# Patient Record
Sex: Male | Born: 2000
Health system: Southern US, Community
[De-identification: ages and names within clinical notes are randomized; demographics above are authoritative.]

## PROBLEM LIST (undated history)

## (undated) DIAGNOSIS — J45909 Unspecified asthma, uncomplicated: Secondary | ICD-10-CM

## (undated) DIAGNOSIS — S060X9A Concussion with loss of consciousness of unspecified duration, initial encounter: Secondary | ICD-10-CM

---
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
## 2022-05-22 ENCOUNTER — Emergency Department (HOSPITAL_COMMUNITY)
Admission: EM | Admit: 2022-05-22 | Discharge: 2022-05-22 | Disposition: A | Discharge status: Home / Self Care | Payer: PRIVATE HEALTH INSURANCE | Attending: Emergency Medicine | Admitting: Emergency Medicine

## 2022-05-22 ENCOUNTER — Other Ambulatory Visit: Payer: Self-pay

## 2022-05-22 ENCOUNTER — Encounter (HOSPITAL_COMMUNITY): Payer: Self-pay | Admitting: *Deleted

## 2022-05-22 ENCOUNTER — Emergency Department (HOSPITAL_COMMUNITY): Payer: PRIVATE HEALTH INSURANCE

## 2022-05-22 DIAGNOSIS — M62838 Other muscle spasm: Secondary | ICD-10-CM | POA: Insufficient documentation

## 2022-05-22 DIAGNOSIS — R55 Syncope and collapse: Secondary | ICD-10-CM | POA: Diagnosis present

## 2022-05-22 DIAGNOSIS — Z79899 Other long term (current) drug therapy: Secondary | ICD-10-CM | POA: Diagnosis not present

## 2022-05-22 DIAGNOSIS — J45909 Unspecified asthma, uncomplicated: Secondary | ICD-10-CM | POA: Diagnosis not present

## 2022-05-22 DIAGNOSIS — F419 Anxiety disorder, unspecified: Secondary | ICD-10-CM | POA: Diagnosis not present

## 2022-05-22 LAB — URINALYSIS, ROUTINE W REFLEX MICROSCOPIC
Bilirubin Urine: NEGATIVE
Glucose, UA: NEGATIVE mg/dL
Hgb urine dipstick: NEGATIVE
Ketones, ur: NEGATIVE mg/dL
Leukocytes,Ua: NEGATIVE
Nitrite: NEGATIVE
Protein, ur: NEGATIVE mg/dL
Specific Gravity, Urine: 1.004 — ABNORMAL LOW (ref 1.005–1.030)
pH: 9 — ABNORMAL HIGH (ref 5.0–8.0)

## 2022-05-22 LAB — RAPID URINE DRUG SCREEN, HOSP PERFORMED
Amphetamines: NOT DETECTED
Barbiturates: NOT DETECTED
Benzodiazepines: NOT DETECTED
Cocaine: NOT DETECTED
Opiates: NOT DETECTED
Tetrahydrocannabinol: POSITIVE — AB

## 2022-05-22 LAB — TROPONIN I (HIGH SENSITIVITY)
Troponin I (High Sensitivity): 4 ng/L (ref <18)
Troponin I (High Sensitivity): 3 ng/L (ref <18)

## 2022-05-22 LAB — BASIC METABOLIC PANEL
Anion gap: 7 (ref 5–15)
BUN: 11 mg/dL (ref 6–20)
CO2: 22 mmol/L (ref 22–32)
Calcium: 9.4 mg/dL (ref 8.9–10.3)
Chloride: 108 mmol/L (ref 98–111)
Creatinine, Ser: 0.79 mg/dL (ref 0.61–1.24)
GFR, Estimated: >60 mL/min (ref >60)
Glucose, Bld: 97 mg/dL (ref 70–99)
Potassium: 3.8 mmol/L (ref 3.5–5.1)
Sodium: 137 mmol/L (ref 135–145)

## 2022-05-22 LAB — CBC
HCT: 45.1 % (ref 39.0–52.0)
Hemoglobin: 15.5 g/dL (ref 13.0–17.0)
MCH: 30.2 pg (ref 26.0–34.0)
MCHC: 34.4 g/dL (ref 30.0–36.0)
MCV: 87.7 fL (ref 80.0–100.0)
Platelets: 291 10*3/uL (ref 150–400)
RBC: 5.14 MIL/uL (ref 4.22–5.81)
RDW: 12.1 % (ref 11.5–15.5)
WBC: 5.6 10*3/uL (ref 4.0–10.5)
nRBC: 0 % (ref 0.0–0.2)

## 2022-05-22 LAB — CK: Total CK: 119 U/L (ref 49–397)

## 2022-05-22 LAB — CBG MONITORING, ED: Glucose-Capillary: 101 mg/dL — ABNORMAL HIGH (ref 70–99)

## 2022-05-22 LAB — MAGNESIUM: Magnesium: 2.3 mg/dL (ref 1.7–2.4)

## 2022-05-22 MED ORDER — DIAZEPAM 5 MG PO TABS
5.0000 mg | ORAL_TABLET | Freq: Once | ORAL | Status: AC
  Administered 2022-05-22: 5 mg via ORAL
  Filled 2022-05-22: qty 1

## 2022-05-22 MED ORDER — SODIUM CHLORIDE 0.9 % IV BOLUS
1000.0000 mL | Freq: Once | INTRAVENOUS | Status: AC
  Administered 2022-05-22: 1000 mL via INTRAVENOUS

## 2022-05-22 MED ORDER — HYDROXYZINE HCL 25 MG PO TABS: 25.0000 mg | ORAL_TABLET | Freq: Four times a day (QID) | ORAL | 0 refills | Status: DC | PRN

## 2022-05-22 MED ORDER — METHOCARBAMOL 500 MG PO TABS: 1000.0000 mg | ORAL_TABLET | Freq: Two times a day (BID) | ORAL | 0 refills | Status: AC

## 2022-05-22 NOTE — Discharge Instructions (Signed)
Have someone stay with you until you feel stable. Do not drive, operate machinery, or play sports until your caregiver says it is okay. Keep all follow-up appointments as directed by your caregiver. Lie down right away if you start feeling like you might faint. Breathe deeply and steadily. Wait until all the symptoms have passed.Drink enough fluids to keep your urine clear or pale yellow.   SEEK IMMEDIATE MEDICAL CARE IF: You have a severe headache. You have unusual pain in the chest, abdomen, or back. You are bleeding from the mouth or rectum, or you have a black or tarry stool. You have an irregular or very fast heartbeat. You have pain with breathing. You have repeated fainting or seizure-like jerking during an episode. You faint when sitting or lying down. You have confusion. You have difficulty walking. You have severe weakness. You have vision problems. If you fainted, call your local emergency services - do not drive yourself to the hospital.   Please return to the emergency department immediately for any new or concerning symptoms, or if you get worse.

## 2022-05-22 NOTE — ED Notes (Signed)
Pt requesting his IV to be removed and put in a different location

## 2022-05-22 NOTE — ED Provider Notes (Signed)
[facility] EMERGENCY DEPARTMENT Provider Note   CSN: [id number] Arrival date & time: 05/22/22  3016     History  Chief Complaint  Patient presents with   Loss of Consciousness    Anthony Bonilla is a 21 y.o. male.  Patient as above with significant medical history as below, including asthma, concussion who presents to the ED with complaint of loc, muscle cramps, multiple complaints. Reports over the pats few months has been having episodes where his body will tense up, start having muscle cramps in his arms/legs, tingling hands/feet. Will resolve after a few seconds. No LOC during this time. Feels episodes have been more frequent last few days. Multiple episodes a day. While showering this morning he felt like he passed out, he remembers washing his hair and then woke up in his girlfriends arms on the floor, was not confused or acting abnormally per spouse, no vomiting or incontinence. No CP or palpitations prior to this or following this. Pt does report feeling mildly nauseated prior to episode this AM but no vomiting. Had repeat episode while he was in the car on the way to ED, felt like his "eyes went black" but he could still perceive his surroundings and hear what was going on around him.  Endorses intermittent THC use, last use was yesterday No etoh use, no other illicit drug use. Has not seen PCP for this in the past No hx of sudden cardiac death or arrhythmia.      Past Medical History:  Diagnosis Date   Asthma    Concussion 07/2016   has been cleared to play soccer again    Past Surgical History:  Procedure Laterality Date   OPEN REDUCTION INTERNAL FIXATION (ORIF) METACARPAL Right 09/01/2016   Procedure: OPEN REDUCTION INTERNAL FIXATION (ORIF) RIGHT FIFTH METACARPAL;  Surgeon: Vickki Hearing, MD;  Location: [facility];  Service: Orthopedics;  Laterality: Right;  right fifth metacarpal   TRIGGER FINGER RELEASE Right 10/29/2020   Procedure: RELEASE TRIGGER FINGER/A-1  PULLEY right small finger/ and HARDWARE REMOVAL RIGHT HAND;  Surgeon: Vickki Hearing, MD;  Location: [facility];  Service: Orthopedics;  Laterality: Right;     The history is provided by the patient and the spouse. No language interpreter was used.  Loss of Consciousness Associated symptoms: nausea   Associated symptoms: no chest pain, no confusion, no fever, no headaches, no palpitations, no shortness of breath and no vomiting        Home Medications Prior to Admission medications   Medication Sig Start Date End Date Taking? Authorizing Provider  hydrOXYzine (ATARAX) 25 MG tablet Take 1 tablet (25 mg total) by mouth every 6 (six) hours as needed for anxiety. 05/22/22  Yes Sloan Leiter, DO  methocarbamol (ROBAXIN) 500 MG tablet Take 2 tablets (1,000 mg total) by mouth 2 (two) times daily for 5 days. 05/22/22 05/27/22 Yes Tanda Rockers A, DO  ibuprofen (ADVIL) 800 MG tablet Take 1 tablet (800 mg total) by mouth every 8 (eight) hours as needed. Patient not taking: Reported on 02/10/2021 10/29/20   Vickki Hearing, MD      Allergies    Patient has no known allergies.    Review of Systems   Review of Systems  Constitutional:  Negative for chills and fever.  HENT:  Negative for facial swelling and trouble swallowing.   Eyes:  Negative for photophobia and visual disturbance.  Respiratory:  Negative for cough and shortness of breath.   Cardiovascular:  Positive for  syncope. Negative for chest pain and palpitations.  Gastrointestinal:  Positive for nausea. Negative for abdominal pain and vomiting.  Endocrine: Negative for polydipsia and polyuria.  Genitourinary:  Negative for difficulty urinating and hematuria.  Musculoskeletal:  Positive for myalgias. Negative for gait problem and joint swelling.  Skin:  Negative for pallor and rash.  Neurological:  Positive for syncope and light-headedness. Negative for headaches.  Psychiatric/Behavioral:  Negative for agitation and confusion.      Physical Exam Updated Vital Signs BP (!) 115/54   Pulse (!) 49   Temp 97.8 F (36.6 C) (Oral)   Resp 19   Ht 6\' 1"  (1.854 m)   Wt 74.8 kg   SpO2 100%   BMI 21.77 kg/m  Physical Exam Vitals and nursing note reviewed.  Constitutional:      General: He is not in acute distress.    Appearance: Normal appearance. He is well-developed. He is not ill-appearing, toxic-appearing or diaphoretic.  HENT:     Head: Normocephalic and atraumatic. No raccoon eyes, Battle's sign, right periorbital erythema or left periorbital erythema.     Jaw: There is normal jaw occlusion. No trismus.     Comments: Poor dentition     Right Ear: External ear normal.     Left Ear: External ear normal.     Mouth/Throat:     Mouth: Mucous membranes are moist.  Eyes:     General: No scleral icterus.    Extraocular Movements: Extraocular movements intact.     Pupils: Pupils are equal, round, and reactive to light.  Cardiovascular:     Rate and Rhythm: Regular rhythm. Bradycardia present.     Pulses: Normal pulses.          Radial pulses are 2+ on the right side and 2+ on the left side.       Dorsalis pedis pulses are 2+ on the right side and 2+ on the left side.     Heart sounds: Normal heart sounds. Heart sounds not distant.     No gallop. No S3 or S4 sounds.  Pulmonary:     Effort: Pulmonary effort is normal. No respiratory distress.     Breath sounds: Normal breath sounds.  Abdominal:     General: Abdomen is flat.     Palpations: Abdomen is soft.     Tenderness: There is no abdominal tenderness.  Musculoskeletal:        General: Normal range of motion.     Cervical back: Normal range of motion.     Right lower leg: No edema.     Left lower leg: No edema.  Skin:    General: Skin is warm and dry.     Capillary Refill: Capillary refill takes less than 2 seconds.  Neurological:     Mental Status: He is alert and oriented to person, place, and time.     GCS: GCS eye subscore is 4. GCS verbal  subscore is 5. GCS motor subscore is 6.     Cranial Nerves: Cranial nerves 2-12 are intact. No dysarthria or facial asymmetry.     Sensory: Sensation is intact. No sensory deficit.     Motor: Motor function is intact. No tremor.     Coordination: Coordination is intact. Finger-Nose-Finger Test normal.     Gait: Gait is intact.     Comments: Strength BLLE 5/5 Strength BLUE 5/5  Psychiatric:        Mood and Affect: Mood normal.  Behavior: Behavior normal.     ED Results / Procedures / Treatments   Labs (all labs ordered are listed, but only abnormal results are displayed) Labs Reviewed  URINALYSIS, ROUTINE W REFLEX MICROSCOPIC - Abnormal; Notable for the following components:      Result Value   Color, Urine STRAW (*)    Specific Gravity, Urine 1.004 (*)    pH 9.0 (*)    All other components within normal limits  RAPID URINE DRUG SCREEN, HOSP PERFORMED - Abnormal; Notable for the following components:   Tetrahydrocannabinol POSITIVE (*)    All other components within normal limits  CBG MONITORING, ED - Abnormal; Notable for the following components:   Glucose-Capillary 101 (*)    All other components within normal limits  BASIC METABOLIC PANEL  CBC  CK  MAGNESIUM  TROPONIN I (HIGH SENSITIVITY)  TROPONIN I (HIGH SENSITIVITY)    EKG EKG Interpretation  Date/Time:  Monday May 22 2022 06:43:15 EDT Ventricular Rate:  56 PR Interval:  109 QRS Duration: 100 QT Interval:  399 QTC Calculation: 385 R Axis:   92 Text Interpretation: Sinus rhythm w/ sinus arrhythmia (now absent: junctional rhythm) Rightward axis Otherwise normal ECG Compared to previous tracing Confirmed by Greg Cutter ([id number]) on 05/22/2022 10:52:07 AM  Radiology DG Chest Portable 1 View  Result Date: 05/22/2022 CLINICAL DATA:  Pt c/o having "passing out" episodes that started x 3 days ago. Right before the episodes start patient has cramping all over bodyloc EXAM: PORTABLE CHEST 1 VIEW COMPARISON:   None Available. FINDINGS: Normal mediastinum and cardiac silhouette. Normal pulmonary vasculature. No evidence of effusion, infiltrate, or pneumothorax. No acute bony abnormality. IMPRESSION: No acute cardiopulmonary process. Electronically Signed   By: Genevive Bi M.D.   On: 05/22/2022 08:07    Procedures Procedures    Medications Ordered in ED Medications  sodium chloride 0.9 % bolus 1,000 mL (0 mLs Intravenous Stopped 05/22/22 1202)  diazepam (VALIUM) tablet 5 mg (5 mg Oral Given 05/22/22 1610)    ED Course/ Medical Decision Making/ A&P                           Medical Decision Making Amount and/or Complexity of Data Reviewed Labs: ordered. Radiology: ordered.  Risk Prescription drug management.    CC: ?loc, muscle spasms  This patient presents to the Emergency Department for the above complaint. This involves an extensive number of treatment options and is a complaint that carries with it a high risk of complications and morbidity. Vital signs were reviewed. Serious etiologies considered.  Differential diagnoses includes but not limited to psychogenic, seizure, atypical/psychogenic seizure, substance induced, cardiac, metabolic, endocrine derangement, other acute etiologies were considered.   Record review:  Previous records obtained and reviewed prior visits, prior labs/imaging/admission   Additional history obtained from spouse, parent  Medical and surgical history as noted above.   Work up as above, notable for:  Labs & imaging results that were available during my care of the patient were visualized by me and considered in my medical decision making.  Physical exam as above.   I ordered imaging studies which included chest x-ray. I visualized the imaging, interpreted images, and I agree with radiologist interpretation.  No acute process  Cardiac monitoring reviewed and interpreted personally which shows NSR   Labs reviewed, UDS positive for THC, otherwise  labs unremarkable ECG without acute ischemic changes, troponin negative x2, chest x-ray negative, no chest pain.  Low  suspicion ACS. Neurologically he is intact entirely  Management: Valium, IV fluids  ED Course:     Reassessment:  Symptoms improved.  Admission was considered.   Patient presents with syncopal symptoms without worrisome features. Presentation most suggestive of neuro-cardiogenic or orthostatic cause. Very low suspicion for serious arrhythmia, cardiac ischemia or other serious etiology. ECG reviewed, no evidence of a cardiac arrhythmia such as Brugada, WPW, HOCM, IHSS, Long or short QT.  Syncope warnings discussed with patient.   Neurologic exam is nonfocal, not consistent with CVA or primary neurologic abnormality.   Patient appears safe for discharge with outpatient observation and close PCP F/U.   Feel there is potentially a psychogenic component to the patient's symptoms today.  He did have apparent unprovoked syncopal episode without prodrome earlier today.  We will give patient cardiology follow-up for Holter monitor.  No arrhythmia on telemetry today but does have sinus bradycardia.  Reports his heart rate is typically low.  Given resource info regarding counseling, give atarax rx   The patient has been instructed to return immediately if the symptoms worsen in any way. Patient verbalized understanding and is in agreement with current care plan. All questions answered prior to discharge.             Social determinants of health include -  Social History   Socioeconomic History   Marital status: Single    Spouse name: Not on file   Number of children: Not on file   Years of education: Not on file   Highest education level: Not on file  Occupational History   Not on file  Tobacco Use   Smoking status: Passive Smoke Exposure - Never Smoker   Smokeless tobacco: Never  Vaping Use   Vaping Use: Every day   Substances: Nicotine, Flavoring   Substance and Sexual Activity   Alcohol use: No   Drug use: No   Sexual activity: Never    Birth control/protection: None  Other Topics Concern   Not on file  Social History Narrative   Not on file   Social Determinants of Health   Financial Resource Strain: Not on file  Food Insecurity: Not on file  Transportation Needs: Not on file  Physical Activity: Not on file  Stress: Not on file  Social Connections: Not on file  Intimate Partner Violence: Not on file      This chart was dictated using voice recognition software.  Despite best efforts to proofread,  errors can occur which can change the documentation meaning.         Final Clinical Impression(s) / ED Diagnoses Final diagnoses:  Anxiousness  Muscle spasm  Syncope, unspecified syncope type    Rx / DC Orders ED Discharge Orders          Ordered    methocarbamol (ROBAXIN) 500 MG tablet  2 times daily        05/22/22 1230    hydrOXYzine (ATARAX) 25 MG tablet  Every 6 hours PRN        05/22/22 1230    Ambulatory referral to Cardiology        05/22/22 1231              Sloan Leiter, DO 05/22/22 1233

## 2022-05-22 NOTE — ED Triage Notes (Signed)
Pt c/o having "passing out" episodes that started x 3 days ago. Right before the episodes start patient has cramping all over body

## 2022-11-15 ENCOUNTER — Emergency Department (HOSPITAL_COMMUNITY)
Admission: EM | Admit: 2022-11-15 | Discharge: 2022-11-15 | Disposition: A | Discharge status: Home / Self Care | Payer: PRIVATE HEALTH INSURANCE | Attending: Emergency Medicine | Admitting: Emergency Medicine

## 2022-11-15 ENCOUNTER — Other Ambulatory Visit: Payer: Self-pay

## 2022-11-15 DIAGNOSIS — R197 Diarrhea, unspecified: Secondary | ICD-10-CM | POA: Insufficient documentation

## 2022-11-15 DIAGNOSIS — M62838 Other muscle spasm: Secondary | ICD-10-CM | POA: Insufficient documentation

## 2022-11-15 DIAGNOSIS — R252 Cramp and spasm: Secondary | ICD-10-CM

## 2022-11-15 DIAGNOSIS — J45909 Unspecified asthma, uncomplicated: Secondary | ICD-10-CM | POA: Insufficient documentation

## 2022-11-15 DIAGNOSIS — R112 Nausea with vomiting, unspecified: Secondary | ICD-10-CM | POA: Diagnosis not present

## 2022-11-15 LAB — LIPASE, BLOOD: Lipase: 33 U/L (ref 11–51)

## 2022-11-15 LAB — CBC WITH DIFFERENTIAL/PLATELET
Abs Immature Granulocytes: 0.01 10*3/uL (ref 0.00–0.07)
Basophils Absolute: 0 10*3/uL (ref 0.0–0.1)
Basophils Relative: 1 %
Eosinophils Absolute: 0.1 10*3/uL (ref 0.0–0.5)
Eosinophils Relative: 2 %
HCT: 46.2 % (ref 39.0–52.0)
Hemoglobin: 15.5 g/dL (ref 13.0–17.0)
Immature Granulocytes: 0 %
Lymphocytes Relative: 24 %
Lymphs Abs: 1.7 10*3/uL (ref 0.7–4.0)
MCH: 30.2 pg (ref 26.0–34.0)
MCHC: 33.5 g/dL (ref 30.0–36.0)
MCV: 90.1 fL (ref 80.0–100.0)
Monocytes Absolute: 0.5 10*3/uL (ref 0.1–1.0)
Monocytes Relative: 7 %
Neutro Abs: 4.7 10*3/uL (ref 1.7–7.7)
Neutrophils Relative %: 66 %
Platelets: 278 10*3/uL (ref 150–400)
RBC: 5.13 MIL/uL (ref 4.22–5.81)
RDW: 11.8 % (ref 11.5–15.5)
WBC: 7.1 10*3/uL (ref 4.0–10.5)
nRBC: 0 % (ref 0.0–0.2)

## 2022-11-15 LAB — COMPREHENSIVE METABOLIC PANEL
ALT: 30 U/L (ref 0–44)
AST: 30 U/L (ref 15–41)
Albumin: 5 g/dL (ref 3.5–5.0)
Alkaline Phosphatase: 56 U/L (ref 38–126)
Anion gap: 10 (ref 5–15)
BUN: 8 mg/dL (ref 6–20)
CO2: 23 mmol/L (ref 22–32)
Calcium: 9.4 mg/dL (ref 8.9–10.3)
Chloride: 104 mmol/L (ref 98–111)
Creatinine, Ser: 0.75 mg/dL (ref 0.61–1.24)
GFR, Estimated: >60 mL/min (ref >60)
Glucose, Bld: 98 mg/dL (ref 70–99)
Potassium: 3.3 mmol/L — ABNORMAL LOW (ref 3.5–5.1)
Sodium: 137 mmol/L (ref 135–145)
Total Bilirubin: 1.5 mg/dL — ABNORMAL HIGH (ref 0.3–1.2)
Total Protein: 8.1 g/dL (ref 6.5–8.1)

## 2022-11-15 MED ORDER — ONDANSETRON 4 MG PO TBDP: 4.0000 mg | ORAL_TABLET | Freq: Three times a day (TID) | ORAL | 0 refills | Status: AC | PRN

## 2022-11-15 MED ORDER — ONDANSETRON HCL 4 MG/2ML IJ SOLN
4.0000 mg | Freq: Once | INTRAMUSCULAR | Status: AC
  Administered 2022-11-15: 4 mg via INTRAVENOUS
  Filled 2022-11-15: qty 2

## 2022-11-15 MED ORDER — POTASSIUM CHLORIDE CRYS ER 20 MEQ PO TBCR
40.0000 meq | EXTENDED_RELEASE_TABLET | Freq: Once | ORAL | Status: AC
  Administered 2022-11-15: 40 meq via ORAL
  Filled 2022-11-15: qty 2

## 2022-11-15 NOTE — ED Provider Notes (Signed)
[place] Provider Note   CSN: [id number] Arrival date & time: 11/15/22  0343     History  Chief Complaint  Patient presents with   Emesis    Anthony Bonilla is a 22 y.o. male.  HPI     This is a 22 year old male who presents with nausea, vomiting, diarrhea.  Patient reports that he woke up this morning and has had multiple episodes of emesis.  He states that the emesis turned bloody and this concerned him.  He also describes cramping in his legs and his arm which was "severe."  Denies fevers or other recent illnesses.  No known sick contacts.  Denies significant abdominal pain.  Home Medications Prior to Admission medications   Medication Sig Start Date End Date Taking? Authorizing Provider  ondansetron (ZOFRAN-ODT) 4 MG disintegrating tablet Take 1 tablet (4 mg total) by mouth every 8 (eight) hours as needed for nausea or vomiting. 11/15/22  Yes Staci Carver, Barbette Hair, MD  hydrOXYzine (ATARAX) 25 MG tablet Take 1 tablet (25 mg total) by mouth every 6 (six) hours as needed for anxiety. 05/22/22   Jeanell Sparrow, DO  ibuprofen (ADVIL) 800 MG tablet Take 1 tablet (800 mg total) by mouth every 8 (eight) hours as needed. Patient not taking: Reported on 02/10/2021 10/29/20   Carole Civil, MD      Allergies    Patient has no known allergies.    Review of Systems   Review of Systems  Constitutional:  Negative for fever.  Gastrointestinal:  Positive for diarrhea, nausea and vomiting. Negative for abdominal pain.  All other systems reviewed and are negative.   Physical Exam Updated Vital Signs BP 133/64   Pulse (!) 44   Temp 97.9 F (36.6 C) (Oral)   Resp 16   Ht 1.88 m (6\' 2" )   Wt 72.6 kg   SpO2 99%   BMI 20.54 kg/m  Physical Exam Vitals and nursing note reviewed.  Constitutional:      Appearance: He is well-developed. He is not ill-appearing.  HENT:     Head: Normocephalic and atraumatic.  Eyes:     Pupils: Pupils  are equal, round, and reactive to light.  Cardiovascular:     Rate and Rhythm: Normal rate and regular rhythm.     Heart sounds: Normal heart sounds. No murmur heard. Pulmonary:     Effort: Pulmonary effort is normal. No respiratory distress.     Breath sounds: Normal breath sounds. No wheezing.  Abdominal:     General: Bowel sounds are normal.     Palpations: Abdomen is soft.     Tenderness: There is no abdominal tenderness. There is no rebound.  Musculoskeletal:     Cervical back: Neck supple.  Lymphadenopathy:     Cervical: No cervical adenopathy.  Skin:    General: Skin is warm and dry.  Neurological:     Mental Status: He is alert and oriented to person, place, and time.  Psychiatric:        Mood and Affect: Mood normal.     ED Results / Procedures / Treatments   Labs (all labs ordered are listed, but only abnormal results are displayed) Labs Reviewed  COMPREHENSIVE METABOLIC PANEL - Abnormal; Notable for the following components:      Result Value   Potassium 3.3 (*)    Total Bilirubin 1.5 (*)    All other components within normal limits  CBC WITH DIFFERENTIAL/PLATELET  LIPASE,  BLOOD    EKG None  Radiology No results found.  Procedures Procedures    Medications Ordered in ED Medications  ondansetron (ZOFRAN) injection 4 mg (4 mg Intravenous Given 11/15/22 0423)  potassium chloride SA (KLOR-CON M) CR tablet 40 mEq (40 mEq Oral Given 11/15/22 0508)    ED Course/ Medical Decision Making/ A&P                             Medical Decision Making Amount and/or Complexity of Data Reviewed Labs: ordered.  Risk Prescription drug management.   This patient presents to the ED for concern of nausea, vomiting, diarrhea, this involves an extensive number of treatment options, and is a complaint that carries with it a high risk of complications and morbidity.  I considered the following differential and admission for this acute, potentially life threatening  condition.  The differential diagnosis includes viral illness causing gastroenteritis, obstruction, pancreatitis, cholecystitis, Mallory-Weiss tear  MDM:    This is a 23 year old male who presents with nausea, vomiting, diarrhea.  Overall nontoxic and vital signs are reassuring.  Abdominal exam is benign.  Given description, highly suspect he sustained a Mallory-Weiss tear secondary to vomiting.  Given his benign exam, would favor viral illness over intra-abdominal pathology such as obstruction or pancreatitis.  Labs obtained and reviewed.  Labs are only notable for slight hypokalemia.  This was replaced.  Patient was given Zofran and able to orally hydrate.  (Labs, imaging, consults)  Labs: I Ordered, and personally interpreted labs.  The pertinent results include: CBC, CMP, lipase  Imaging Studies ordered: I ordered imaging studies including none I independently visualized and interpreted imaging. I agree with the radiologist interpretation  Additional history obtained from significant other at bedside.  External records from outside source obtained and reviewed including prior evaluations  Cardiac Monitoring: The patient was maintained on a cardiac monitor.  I personally viewed and interpreted the cardiac monitored which showed an underlying rhythm of: Sinus rhythm  Reevaluation: After the interventions noted above, I reevaluated the patient and found that they have :improved  Social Determinants of Health:  lives independently  Disposition: Discharge  Co morbidities that complicate the patient evaluation  Past Medical History:  Diagnosis Date   Asthma    Concussion 07/2016   has been cleared to play soccer again     Medicines Meds ordered this encounter  Medications   ondansetron (ZOFRAN) injection 4 mg   potassium chloride SA (KLOR-CON M) CR tablet 40 mEq   ondansetron (ZOFRAN-ODT) 4 MG disintegrating tablet    Sig: Take 1 tablet (4 mg total) by mouth every 8 (eight)  hours as needed for nausea or vomiting.    Dispense:  20 tablet    Refill:  0    I have reviewed the patients home medicines and have made adjustments as needed  Problem List / ED Course: Problem List Items Addressed This Visit   None Visit Diagnoses     Nausea vomiting and diarrhea    -  Primary   Spasm                       Final Clinical Impression(s) / ED Diagnoses Final diagnoses:  Nausea vomiting and diarrhea  Spasm    Rx / DC Orders ED Discharge Orders          Ordered    ondansetron (ZOFRAN-ODT) 4 MG disintegrating tablet  Every 8 hours  PRN        11/15/22 0505              Merryl Hacker, MD 11/15/22 [phone]

## 2022-11-15 NOTE — Discharge Instructions (Signed)
You were seen today for vomiting and diarrhea.  Your workup was reassuring.  Your potassium was slightly low.  This was replaced.  Increase potassium rich foods.  Your hemoglobin was normal.

## 2022-11-15 NOTE — ED Triage Notes (Signed)
Pt c/o weakness and emesis. Pt reports he believes he vomited blood at least 4 times.    4mg  Zofran IM given by EMS PTA

## 2022-12-14 ENCOUNTER — Encounter: Payer: Self-pay | Admitting: Radiology

## 2023-03-12 ENCOUNTER — Other Ambulatory Visit: Payer: Self-pay

## 2023-03-12 ENCOUNTER — Emergency Department (HOSPITAL_COMMUNITY)
Admission: EM | Admit: 2023-03-12 | Discharge: 2023-03-12 | Disposition: A | Discharge status: Home / Self Care | Payer: PRIVATE HEALTH INSURANCE | Attending: Emergency Medicine | Admitting: Emergency Medicine

## 2023-03-12 ENCOUNTER — Encounter (HOSPITAL_COMMUNITY): Payer: Self-pay | Admitting: Emergency Medicine

## 2023-03-12 ENCOUNTER — Emergency Department (HOSPITAL_COMMUNITY): Payer: PRIVATE HEALTH INSURANCE

## 2023-03-12 DIAGNOSIS — R112 Nausea with vomiting, unspecified: Secondary | ICD-10-CM | POA: Insufficient documentation

## 2023-03-12 DIAGNOSIS — J45909 Unspecified asthma, uncomplicated: Secondary | ICD-10-CM | POA: Diagnosis not present

## 2023-03-12 DIAGNOSIS — R1031 Right lower quadrant pain: Secondary | ICD-10-CM | POA: Insufficient documentation

## 2023-03-12 LAB — URINALYSIS, ROUTINE W REFLEX MICROSCOPIC
Bilirubin Urine: NEGATIVE
Glucose, UA: NEGATIVE mg/dL
Hgb urine dipstick: NEGATIVE
Ketones, ur: NEGATIVE mg/dL
Leukocytes,Ua: NEGATIVE
Nitrite: NEGATIVE
Protein, ur: NEGATIVE mg/dL
Specific Gravity, Urine: 1.018 (ref 1.005–1.030)
pH: 5 (ref 5.0–8.0)

## 2023-03-12 LAB — COMPREHENSIVE METABOLIC PANEL
ALT: 20 U/L (ref 0–44)
AST: 22 U/L (ref 15–41)
Albumin: 4.4 g/dL (ref 3.5–5.0)
Alkaline Phosphatase: 52 U/L (ref 38–126)
Anion gap: 11 (ref 5–15)
BUN: 7 mg/dL (ref 6–20)
CO2: 22 mmol/L (ref 22–32)
Calcium: 9.2 mg/dL (ref 8.9–10.3)
Chloride: 102 mmol/L (ref 98–111)
Creatinine, Ser: 0.79 mg/dL (ref 0.61–1.24)
GFR, Estimated: >60 mL/min (ref >60)
Glucose, Bld: 108 mg/dL — ABNORMAL HIGH (ref 70–99)
Potassium: 4.2 mmol/L (ref 3.5–5.1)
Sodium: 135 mmol/L (ref 135–145)
Total Bilirubin: 0.8 mg/dL (ref 0.3–1.2)
Total Protein: 7.3 g/dL (ref 6.5–8.1)

## 2023-03-12 LAB — CBC
HCT: 42.4 % (ref 39.0–52.0)
Hemoglobin: 14.4 g/dL (ref 13.0–17.0)
MCH: 31 pg (ref 26.0–34.0)
MCHC: 34 g/dL (ref 30.0–36.0)
MCV: 91.4 fL (ref 80.0–100.0)
Platelets: 274 10*3/uL (ref 150–400)
RBC: 4.64 MIL/uL (ref 4.22–5.81)
RDW: 12.2 % (ref 11.5–15.5)
WBC: 6.8 10*3/uL (ref 4.0–10.5)
nRBC: 0 % (ref 0.0–0.2)

## 2023-03-12 LAB — LIPASE, BLOOD: Lipase: 36 U/L (ref 11–51)

## 2023-03-12 MED ORDER — FENTANYL CITRATE (PF) 100 MCG/2ML IJ SOLN
100.0000 ug | Freq: Once | INTRAMUSCULAR | Status: AC
  Administered 2023-03-12: 100 ug via INTRAVENOUS
  Filled 2023-03-12: qty 2

## 2023-03-12 MED ORDER — DICYCLOMINE HCL 20 MG PO TABS: 20.0000 mg | ORAL_TABLET | Freq: Two times a day (BID) | ORAL | 0 refills | Status: DC | PRN

## 2023-03-12 MED ORDER — BUSPIRONE HCL 5 MG PO TABS: 5.0000 mg | ORAL_TABLET | Freq: Once | ORAL | Status: DC

## 2023-03-12 MED ORDER — DICYCLOMINE HCL 10 MG PO CAPS
10.0000 mg | ORAL_CAPSULE | Freq: Once | ORAL | Status: AC
  Administered 2023-03-12: 10 mg via ORAL
  Filled 2023-03-12: qty 1

## 2023-03-12 MED ORDER — LACTATED RINGERS IV BOLUS
1000.0000 mL | Freq: Once | INTRAVENOUS | Status: AC
  Administered 2023-03-12: 1000 mL via INTRAVENOUS

## 2023-03-12 MED ORDER — ONDANSETRON HCL 4 MG/2ML IJ SOLN
4.0000 mg | Freq: Once | INTRAMUSCULAR | Status: AC
  Administered 2023-03-12: 4 mg via INTRAVENOUS
  Filled 2023-03-12: qty 2

## 2023-03-12 MED ORDER — KETOROLAC TROMETHAMINE 15 MG/ML IJ SOLN
15.0000 mg | Freq: Once | INTRAMUSCULAR | Status: AC
  Administered 2023-03-12: 15 mg via INTRAVENOUS
  Filled 2023-03-12: qty 1

## 2023-03-12 MED ORDER — HYDROMORPHONE HCL 1 MG/ML IJ SOLN
0.5000 mg | Freq: Once | INTRAMUSCULAR | Status: AC
  Administered 2023-03-12: 0.5 mg via INTRAVENOUS
  Filled 2023-03-12: qty 0.5

## 2023-03-12 MED ORDER — IOHEXOL 300 MG/ML  SOLN
100.0000 mL | Freq: Once | INTRAMUSCULAR | Status: AC | PRN
  Administered 2023-03-12: 100 mL via INTRAVENOUS

## 2023-03-12 NOTE — ED Notes (Signed)
Patient transported to CT 

## 2023-03-12 NOTE — ED Notes (Signed)
HR dropped to 29 during triage process and pt reported feeling nauseated and dizzy at the time. Obtained EKG and informed Dr. Durwin Nora of pt presentation.

## 2023-03-12 NOTE — ED Provider Notes (Signed)
[place] EMERGENCY DEPARTMENT AT [facility] Provider Note   CSN: [id number] Arrival date & time: 03/12/23  1127     History  Chief Complaint  Patient presents with   Abdominal Pain    Anthony Bonilla is a 21 y.o. male.   Abdominal Pain Associated symptoms: nausea and vomiting   Patient presents for abdominal pain.  Medical history includes asthma, prior concussion, terminal ileitis.  He had some mild generalized stomach discomfort yesterday.  Today, he was awoken early this morning with severe right lower quadrant abdominal pain.  Current pain is 10/10 in severity.  He has had nausea and vomiting this morning and does have continued nausea.  Pain does not radiate.  He had similar pain in January but far less severe.  He denies any recent injury.  While in ED triage, he had near syncopal symptoms.  These have resolved.      Home Medications Prior to Admission medications   Medication Sig Start Date End Date Taking? Authorizing Provider  acetaminophen (TYLENOL) 500 MG tablet Take 1,000-1,500 mg by mouth every 6 (six) hours as needed for moderate pain or headache.   Yes [provider]  dicyclomine (BENTYL) 20 MG tablet Take 1 tablet (20 mg total) by mouth 2 (two) times daily as needed for spasms. 03/12/23  Yes Terrilee Files, MD  busPIRone (BUSPAR) 7.5 MG tablet Take 1 tablet by mouth daily as needed. Patient not taking: Reported on 03/12/2023    [provider]  escitalopram (LEXAPRO) 10 MG tablet Take 10 mg by mouth daily. Patient not taking: Reported on 03/12/2023 09/16/22   [provider]  ondansetron (ZOFRAN-ODT) 4 MG disintegrating tablet Take 1 tablet (4 mg total) by mouth every 8 (eight) hours as needed for nausea or vomiting. Patient not taking: Reported on 03/12/2023 11/15/22   Horton, Mayer Masker, MD      Allergies    Patient has no known allergies.    Review of Systems   Review of Systems  Constitutional:  Positive for  appetite change.  Gastrointestinal:  Positive for abdominal pain, nausea and vomiting.  Neurological:  Positive for dizziness and light-headedness.  All other systems reviewed and are negative.   Physical Exam Updated Vital Signs BP 126/62   Pulse (!) 46   Temp 97.7 F (36.5 C) (Oral)   Resp 17   Ht 6\' 2"  (1.88 m)   Wt 79.4 kg   SpO2 98%   BMI 22.47 kg/m  Physical Exam Vitals and nursing note reviewed.  Constitutional:      General: He is not in acute distress.    Appearance: He is well-developed. He is not ill-appearing, toxic-appearing or diaphoretic.  HENT:     Head: Normocephalic and atraumatic.     Mouth/Throat:     Mouth: Mucous membranes are moist.  Eyes:     General: No scleral icterus.    Extraocular Movements: Extraocular movements intact.     Conjunctiva/sclera: Conjunctivae normal.  Cardiovascular:     Rate and Rhythm: Normal rate and regular rhythm.  Pulmonary:     Effort: Pulmonary effort is normal. No respiratory distress.  Abdominal:     Palpations: Abdomen is soft.     Tenderness: There is abdominal tenderness in the right lower quadrant. There is no guarding or rebound.  Musculoskeletal:        General: No swelling.     Cervical back: Neck supple.  Skin:    General: Skin is warm and  dry.     Capillary Refill: Capillary refill takes less than 2 seconds.     Coloration: Skin is not cyanotic or jaundiced.  Neurological:     General: No focal deficit present.     Mental Status: He is alert and oriented to person, place, and time.  Psychiatric:        Mood and Affect: Mood normal.        Behavior: Behavior normal.     ED Results / Procedures / Treatments   Labs (all labs ordered are listed, but only abnormal results are displayed) Labs Reviewed  COMPREHENSIVE METABOLIC PANEL - Abnormal; Notable for the following components:      Result Value   Glucose, Bld 108 (*)    All other components within normal limits  LIPASE, BLOOD  CBC  URINALYSIS,  ROUTINE W REFLEX MICROSCOPIC    EKG None  Radiology CT ABDOMEN PELVIS W CONTRAST  Result Date: 03/12/2023 CLINICAL DATA:  Right lower quadrant abdominal pain, emesis, diarrhea EXAM: CT ABDOMEN AND PELVIS WITH CONTRAST TECHNIQUE: Multidetector CT imaging of the abdomen and pelvis was performed using the standard protocol following bolus administration of intravenous contrast. RADIATION DOSE REDUCTION: This exam was performed according to the departmental dose-optimization program which includes automated exposure control, adjustment of the mA and/or kV according to patient size and/or use of iterative reconstruction technique. CONTRAST:  OMNIPAQUE IOHEXOL 300 MG/ML  SOLN COMPARISON:  12/03/2020 FINDINGS: Lower chest: No acute abnormality. Hepatobiliary: No solid liver abnormality is seen. No gallstones, gallbladder wall thickening, or biliary dilatation. Pancreas: Unremarkable. No pancreatic ductal dilatation or surrounding inflammatory changes. Spleen: Normal in size without significant abnormality. Adrenals/Urinary Tract: Adrenal glands are unremarkable. Kidneys are normal, without renal calculi, solid lesion, or hydronephrosis. Bladder is unremarkable. Stomach/Bowel: Stomach is within normal limits. Appendix appears normal (series 5, image 57). No evidence of bowel wall thickening, distention, or inflammatory changes. Vascular/Lymphatic: No significant vascular findings are present. Unchanged prominent right lower quadrant mesenteric lymph nodes (series 2, image 48). No other enlarged abdominal or pelvic lymph nodes. Reproductive: No mass or other significant abnormality. Other: No abdominal wall hernia or abnormality. Small volume free fluid in the low pelvis. Musculoskeletal: No acute or significant osseous findings. IMPRESSION: 1. No acute CT findings of the abdomen or pelvis to explain right lower quadrant abdominal pain. Normal appendix. 2. Small volume free fluid in the low pelvis, nonspecific  and possibly reactive. 3. Unchanged prominent right lower quadrant mesenteric lymph nodes, most likely reactive. Electronically Signed   By: Jearld Lesch M.D.   On: 03/12/2023 14:07    Procedures Procedures    Medications Ordered in ED Medications  fentaNYL (SUBLIMAZE) injection 100 mcg (100 mcg Intravenous Given 03/12/23 1221)  lactated ringers bolus 1,000 mL (0 mLs Intravenous Stopped 03/12/23 1320)  ketorolac (TORADOL) 15 MG/ML injection 15 mg (15 mg Intravenous Given 03/12/23 1221)  ondansetron (ZOFRAN) injection 4 mg (4 mg Intravenous Given 03/12/23 1219)  HYDROmorphone (DILAUDID) injection 0.5 mg (0.5 mg Intravenous Given 03/12/23 1341)  iohexol (OMNIPAQUE) 300 MG/ML solution 100 mL (100 mLs Intravenous Contrast Given 03/12/23 1338)  lactated ringers bolus 1,000 mL (0 mLs Intravenous Stopped 03/12/23 1640)  dicyclomine (BENTYL) capsule 10 mg (10 mg Oral Given 03/12/23 1557)  HYDROmorphone (DILAUDID) injection 0.5 mg (0.5 mg Intravenous Given 03/12/23 1638)    ED Course/ Medical Decision Making/ A&P  Medical Decision Making Amount and/or Complexity of Data Reviewed Labs: ordered. Radiology: ordered.  Risk Prescription drug management.   This patient presents to the ED for concern of abdominal pain, this involves an extensive number of treatment options, and is a complaint that carries with it a high risk of complications and morbidity.  The differential diagnosis includes appendicitis, IBD, constipation, colitis, SBO   Co morbidities that complicate the patient evaluation  asthma, prior concussion, terminal ileitis   Additional history obtained:  Additional history obtained from N/A External records from outside source obtained and reviewed including EMR   Lab Tests:  I Ordered, and personally interpreted labs.  The pertinent results include: Normal hemoglobin, no leukocytosis, normal hepatobiliary enzymes, normal electrolytes   Imaging  Studies ordered:  I ordered imaging studies including CT of abdomen and pelvis I independently visualized and interpreted imaging which showed nonspecific small volume free fluid in pelvis with unchanged prominent RLQ mesenteric lymph nodes I agree with the radiologist interpretation   Cardiac Monitoring: / EKG:  The patient was maintained on a cardiac monitor.  I personally viewed and interpreted the cardiac monitored which showed an underlying rhythm of: Sinus rhythm  Problem List / ED Course / Critical interventions / Medication management  Patient presenting for abdominal pain, nausea, vomiting.  He describes a generalized abdominal pain that localized to right lower quadrant this morning.  On arrival, patient appears uncomfortable.  Near syncopal symptoms while in ED triage but these have resolved.  Current blood pressure is normal.  He has tenderness right lower quadrant.  History is concerning for migrating abdominal pain consistent with appendicitis.  IV fluids, pain medication, and antiemetic were ordered.  Patient undergo CT scanning.  On CT scan, appendix was normal in appearance.  He has what appear to be chronic RLQ mesenteric lymphadenopathy.  There was a small volume of free fluid in the pelvis which is likely reactive.  Patient may have undiagnosed IBD.  He does state that Crohn's disease runs in his family.  He would benefit from gastroenterology follow-up.  Contact information was provided.  After Zofran, patient had resolution of nausea.  He did require some additional pain medications.  Per chart review, he has been prescribed Bentyl in the past.  This was provided in the ED.  Care patient was signed out to oncoming ED provider. I ordered medication including IV fluids for hydration; fentanyl, Dilaudid, Bentyl for analgesia; Zofran for nausea Reevaluation of the patient after these medicines showed that the patient improved I have reviewed the patients home medicines and have made  adjustments as needed   Social Determinants of Health:  Has PCP        Final Clinical Impression(s) / ED Diagnoses Final diagnoses:  Right lower quadrant abdominal pain    Rx / DC Orders ED Discharge Orders          Ordered    dicyclomine (BENTYL) 20 MG tablet  2 times daily PRN        03/12/23 1704              Gloris Manchester, MD 03/12/23 1725

## 2023-03-12 NOTE — ED Provider Notes (Signed)
Signout from Dr. Durwin Nora.  22 year old male with acute on chronic abdominal pain.  Workup has been fairly unremarkable.  He is getting medications for pain control pain. Physical Exam  BP 126/62   Pulse (!) 46   Temp 97.7 F (36.5 C) (Oral)   Resp 17   Ht 6\' 2"  (1.88 m)   Wt 79.4 kg   SpO2 98%   BMI 22.47 kg/m   Physical Exam  Procedures  Procedures  ED Course / MDM    Medical Decision Making Amount and/or Complexity of Data Reviewed Labs: ordered. Radiology: ordered.  Risk Prescription drug management.   Plan is for disposition and outpatient follow-up.  Reviewed with patient and is comfortable plan for discharge.       Terrilee Files, MD 03/13/23 [phone]

## 2023-03-12 NOTE — ED Triage Notes (Addendum)
Pt c/o RLQ abd pain since 0600 this morning. Prior "flares" of appendix and hx IBS. No rebound tenderness or bloody stool reported. LBM today 0900. Pt has vomited twice today PTA. No sweats, chills, diarrhea. Pt reports SOB and lightheadedness in triage. HR noted to be in 40s briefly during period of dizziness. Last food was last night at suppertime; last drank a few sips of soda this morning at around 0900.

## 2023-03-13 ENCOUNTER — Other Ambulatory Visit: Payer: Self-pay

## 2023-03-13 ENCOUNTER — Emergency Department (HOSPITAL_COMMUNITY)
Admission: EM | Admit: 2023-03-13 | Discharge: 2023-03-13 | Disposition: A | Discharge status: Home / Self Care | Payer: PRIVATE HEALTH INSURANCE | Attending: Emergency Medicine | Admitting: Emergency Medicine

## 2023-03-13 ENCOUNTER — Encounter (HOSPITAL_COMMUNITY): Payer: Self-pay | Admitting: Emergency Medicine

## 2023-03-13 DIAGNOSIS — R1031 Right lower quadrant pain: Secondary | ICD-10-CM | POA: Insufficient documentation

## 2023-03-13 LAB — COMPREHENSIVE METABOLIC PANEL
ALT: 23 U/L (ref 0–44)
AST: 29 U/L (ref 15–41)
Albumin: 4.5 g/dL (ref 3.5–5.0)
Alkaline Phosphatase: 53 U/L (ref 38–126)
Anion gap: 10 (ref 5–15)
BUN: 6 mg/dL (ref 6–20)
CO2: 24 mmol/L (ref 22–32)
Calcium: 9.1 mg/dL (ref 8.9–10.3)
Chloride: 105 mmol/L (ref 98–111)
Creatinine, Ser: 0.69 mg/dL (ref 0.61–1.24)
GFR, Estimated: >60 mL/min (ref >60)
Glucose, Bld: 92 mg/dL (ref 70–99)
Potassium: 3.7 mmol/L (ref 3.5–5.1)
Sodium: 139 mmol/L (ref 135–145)
Total Bilirubin: 1.1 mg/dL (ref 0.3–1.2)
Total Protein: 7.2 g/dL (ref 6.5–8.1)

## 2023-03-13 LAB — CBC
HCT: 42 % (ref 39.0–52.0)
Hemoglobin: 13.9 g/dL (ref 13.0–17.0)
MCH: 30.4 pg (ref 26.0–34.0)
MCHC: 33.1 g/dL (ref 30.0–36.0)
MCV: 91.9 fL (ref 80.0–100.0)
Platelets: 266 10*3/uL (ref 150–400)
RBC: 4.57 MIL/uL (ref 4.22–5.81)
RDW: 12 % (ref 11.5–15.5)
WBC: 7.9 10*3/uL (ref 4.0–10.5)
nRBC: 0 % (ref 0.0–0.2)

## 2023-03-13 LAB — URINALYSIS, ROUTINE W REFLEX MICROSCOPIC
Bilirubin Urine: NEGATIVE
Glucose, UA: NEGATIVE mg/dL
Hgb urine dipstick: NEGATIVE
Ketones, ur: NEGATIVE mg/dL
Leukocytes,Ua: NEGATIVE
Nitrite: NEGATIVE
Protein, ur: NEGATIVE mg/dL
Specific Gravity, Urine: 1.009 (ref 1.005–1.030)
pH: 7 (ref 5.0–8.0)

## 2023-03-13 LAB — LIPASE, BLOOD: Lipase: 33 U/L (ref 11–51)

## 2023-03-13 MED ORDER — SODIUM CHLORIDE 0.9 % IV BOLUS
500.0000 mL | Freq: Once | INTRAVENOUS | Status: AC
  Administered 2023-03-13: 500 mL via INTRAVENOUS

## 2023-03-13 MED ORDER — ONDANSETRON HCL 4 MG/2ML IJ SOLN
4.0000 mg | Freq: Once | INTRAMUSCULAR | Status: AC
  Administered 2023-03-13: 4 mg via INTRAVENOUS
  Filled 2023-03-13: qty 2

## 2023-03-13 MED ORDER — MORPHINE SULFATE (PF) 4 MG/ML IV SOLN
4.0000 mg | Freq: Once | INTRAVENOUS | Status: AC
  Administered 2023-03-13: 4 mg via INTRAVENOUS
  Filled 2023-03-13: qty 1

## 2023-03-13 MED ORDER — HYDROMORPHONE HCL 1 MG/ML IJ SOLN
1.0000 mg | Freq: Once | INTRAMUSCULAR | Status: AC
  Administered 2023-03-13: 1 mg via INTRAVENOUS
  Filled 2023-03-13: qty 1

## 2023-03-13 MED ORDER — PANTOPRAZOLE SODIUM 40 MG IV SOLR
40.0000 mg | Freq: Once | INTRAVENOUS | Status: AC
  Administered 2023-03-13: 40 mg via INTRAVENOUS
  Filled 2023-03-13: qty 10

## 2023-03-13 NOTE — ED Notes (Signed)
Pt continues to be tearful, nauseas but unable to vomit.  Pt placed on stretcher in VT3.

## 2023-03-13 NOTE — ED Notes (Signed)
Repeat triage per MD request due to pt status change

## 2023-03-13 NOTE — Discharge Instructions (Signed)
Please follow-up with GI next available appointment.  Clear liquid diet advance as tolerated.

## 2023-03-13 NOTE — ED Provider Triage Note (Signed)
Emergency Medicine Provider Triage Evaluation Note  Anthony Bonilla , a 22 y.o. male  was evaluated in triage.  Pt complains of worsened right lower quadrant abdominal pain, severe since noon today.  Associated with nausea and vomiting.  Was seen here yesterday diagnosed with possible IBD.Marland Kitchen  Review of Systems  Positive: Nausea and vomiting, severe abdominal pain Negative: Fever, diarrhea, abdominal distention  Physical Exam  BP 121/73 (BP Location: Right Arm)   Pulse (!) 48   Temp 98.3 F (36.8 C) (Oral)   Resp (!) 24   Ht 6\' 2"  (1.88 m)   Wt 79.4 kg   SpO2 100%   BMI 22.47 kg/m  Gen:   Awake, moderate distress Resp:  Normal effort  MSK:   Moves extremities without difficulty  Other:  Abdominal pain localizing to right lower abdomen.   Medical Decision Making  Medically screening exam initiated at 8:01 PM.  Appropriate orders placed.  Anthony Bonilla was informed that the remainder of the evaluation will be completed by another provider, this initial triage assessment does not replace that evaluation, and the importance of remaining in the ED until their evaluation is complete.     Anthony Amor, PA-C 03/13/23 2002

## 2023-03-13 NOTE — ED Notes (Signed)
This RN was notified by phlebotomist that pt was unable to ambulate without assistance. Upon evaluation, pt diaphoretic, pale and c/o pain 10/10, unable to lift R arm or take deep breaths. Charm Barges, MD made aware and came to assess pt.   20 g RAC, pt placed in VT3 until room becomes available. Charge RN made aware. Triage RN made aware.

## 2023-03-13 NOTE — ED Triage Notes (Signed)
Pt c/o RLQ abd pain since yesterday morning, seen here yesterday for same. He says the meds he was prescribed have made him feel worse. Denies additional GI symptoms.

## 2023-03-13 NOTE — ED Triage Notes (Signed)
Pt reports increase in RLQ pain with associated nausea but unable to vomit due to empty stomach. Pt vitals are similar to initial set from first triage today. Pt appears pale but is no longer diaphoretic. He is having difficulty staying still, holding and guarding his lower abdomen, and seems to be uncomfortable.

## 2023-03-13 NOTE — ED Provider Notes (Signed)
[place] EMERGENCY DEPARTMENT AT [facility] Provider Note   CSN: [id number] Arrival date & time: 03/13/23  1659     History {Add pertinent medical, surgical, social history, OB history to HPI:1} Chief Complaint  Patient presents with   Abdominal Pain   Emesis    Anthony Bonilla is a 22 y.o. male.  He has had 2 years of intermittent right lower abdominal pain.  He has had multiple tests and imaging, upper and lower endoscopy.  He was seen yesterday for same and had repeat CT imaging.  Was started on Bentyl.  He said he woke up this morning with a little bit of pain and took his first dose of Bentyl this afternoon with abrupt worsening of his pain.  Caused him to be nauseous and lightheaded and diaphoretic.  Symptoms improving somewhat although still has significant pain.  He said usually Dilaudid helps the most.  He is working on getting a GI appointment and the closest 1 is next week.  The history is provided by the patient.  Abdominal Pain Pain location:  RLQ Pain quality: sharp and stabbing   Pain radiates to:  Does not radiate Pain severity:  Severe Onset quality:  Gradual Timing:  Intermittent Progression:  Unchanged Chronicity:  Recurrent Relieved by:  None tried Worsened by:  Nothing Ineffective treatments:  None tried Associated symptoms: no hematemesis, no hematochezia and no hematuria        Home Medications Prior to Admission medications   Medication Sig Start Date End Date Taking? Authorizing Provider  acetaminophen (TYLENOL) 500 MG tablet Take 1,000-1,500 mg by mouth every 6 (six) hours as needed for moderate pain or headache.    [provider]  busPIRone (BUSPAR) 7.5 MG tablet Take 1 tablet by mouth daily as needed. Patient not taking: Reported on 03/12/2023    [provider]  dicyclomine (BENTYL) 20 MG tablet Take 1 tablet (20 mg total) by mouth 2 (two) times daily as needed for spasms. 03/12/23   Terrilee Files, MD   escitalopram (LEXAPRO) 10 MG tablet Take 10 mg by mouth daily. Patient not taking: Reported on 03/12/2023 09/16/22   [provider]  ondansetron (ZOFRAN-ODT) 4 MG disintegrating tablet Take 1 tablet (4 mg total) by mouth every 8 (eight) hours as needed for nausea or vomiting. Patient not taking: Reported on 03/12/2023 11/15/22   Horton, Mayer Masker, MD      Allergies    Patient has no known allergies.    Review of Systems   Review of Systems  Gastrointestinal:  Negative for hematemesis and hematochezia.  Genitourinary:  Negative for hematuria.    Physical Exam Updated Vital Signs BP (!) 110/45 (BP Location: Right Arm)   Pulse (!) 53   Temp 98.3 F (36.8 C) (Oral)   Resp 17   Ht 6\' 2"  (1.88 m)   Wt 79.4 kg   SpO2 100%   BMI 22.47 kg/m  Physical Exam  ED Results / Procedures / Treatments   Labs (all labs ordered are listed, but only abnormal results are displayed) Labs Reviewed  LIPASE, BLOOD  COMPREHENSIVE METABOLIC PANEL  CBC  URINALYSIS, ROUTINE W REFLEX MICROSCOPIC    EKG None  Radiology CT ABDOMEN PELVIS W CONTRAST  Result Date: 03/12/2023 CLINICAL DATA:  Right lower quadrant abdominal pain, emesis, diarrhea EXAM: CT ABDOMEN AND PELVIS WITH CONTRAST TECHNIQUE: Multidetector CT imaging of the abdomen and pelvis was performed using the standard protocol following bolus administration of intravenous contrast. RADIATION  DOSE REDUCTION: This exam was performed according to the departmental dose-optimization program which includes automated exposure control, adjustment of the mA and/or kV according to patient size and/or use of iterative reconstruction technique. CONTRAST:  OMNIPAQUE IOHEXOL 300 MG/ML  SOLN COMPARISON:  12/03/2020 FINDINGS: Lower chest: No acute abnormality. Hepatobiliary: No solid liver abnormality is seen. No gallstones, gallbladder wall thickening, or biliary dilatation. Pancreas: Unremarkable. No pancreatic ductal dilatation or surrounding  inflammatory changes. Spleen: Normal in size without significant abnormality. Adrenals/Urinary Tract: Adrenal glands are unremarkable. Kidneys are normal, without renal calculi, solid lesion, or hydronephrosis. Bladder is unremarkable. Stomach/Bowel: Stomach is within normal limits. Appendix appears normal (series 5, image 57). No evidence of bowel wall thickening, distention, or inflammatory changes. Vascular/Lymphatic: No significant vascular findings are present. Unchanged prominent right lower quadrant mesenteric lymph nodes (series 2, image 48). No other enlarged abdominal or pelvic lymph nodes. Reproductive: No mass or other significant abnormality. Other: No abdominal wall hernia or abnormality. Small volume free fluid in the low pelvis. Musculoskeletal: No acute or significant osseous findings. IMPRESSION: 1. No acute CT findings of the abdomen or pelvis to explain right lower quadrant abdominal pain. Normal appendix. 2. Small volume free fluid in the low pelvis, nonspecific and possibly reactive. 3. Unchanged prominent right lower quadrant mesenteric lymph nodes, most likely reactive. Electronically Signed   By: Jearld Lesch M.D.   On: 03/12/2023 14:07    Procedures Procedures  {Document cardiac monitor, telemetry assessment procedure when appropriate:1}  Medications Ordered in ED Medications  HYDROmorphone (DILAUDID) injection 1 mg (has no administration in time range)  sodium chloride 0.9 % bolus 500 mL (has no administration in time range)  ondansetron (ZOFRAN) injection 4 mg (has no administration in time range)  pantoprazole (PROTONIX) injection 40 mg (has no administration in time range)  morphine (PF) 4 MG/ML injection 4 mg (4 mg Intravenous Given 03/13/23 2010)  ondansetron (ZOFRAN) injection 4 mg (4 mg Intravenous Given 03/13/23 2010)    ED Course/ Medical Decision Making/ A&P   {   Click here for ABCD2, HEART and other calculatorsREFRESH Note before signing :1}                           Medical Decision Making Amount and/or Complexity of Data Reviewed Labs: ordered.  Risk Prescription drug management.   ***  {Document critical care time when appropriate:1} {Document review of labs and clinical decision tools ie heart score, Chads2Vasc2 etc:1}  {Document your independent review of radiology images, and any outside records:1} {Document your discussion with family members, caretakers, and with consultants:1} {Document social determinants of health affecting pt's care:1} {Document your decision making why or why not admission, treatments were needed:1} Final Clinical Impression(s) / ED Diagnoses Final diagnoses:  None    Rx / DC Orders ED Discharge Orders     None

## 2023-03-16 NOTE — Progress Notes (Unsigned)
GI Office Note    Referring Provider: Drema Halon, FNP Primary Care Physician:  Drema Halon, FNP  Primary Gastroenterologist:  Chief Complaint   No chief complaint on file.    History of Present Illness   Anthony Bonilla is a 22 y.o. male presenting today     Seen in ED twice last week with abdominal pain, N/V. History of intermittent RLQ pain for two years. Abnormal RLQ on CT in 2021.   CT A/P with contrast 03/12/23:  IMPRESSION: 1. No acute CT findings of the abdomen or pelvis to explain right lower quadrant abdominal pain. Normal appendix. 2. Small volume free fluid in the low pelvis, nonspecific and possibly reactive. 3. Unchanged prominent right lower quadrant mesenteric lymph nodes, most likely reactive.  CT A/P with contrast 09/2020:  IMPRESSION: 1. Mild inflammatory changes with fat stranding and free fluid in the right lower quadrant with abnormal appearance of both the appendix and the distal/terminal ileum. The appendix is upper normal in size at 7 mm. However the distal and terminal ileum are also abnormal, fluid-filled with mild mucosal enhancement and equivocal wall thickening. It is unclear if this represents a focal appendiceal process such is early appendicitis or a primary terminal ileal process such as enteritis/Crohn's disease. 2. Small amount of free fluid in the right lower quadrant tracks into the pelvis. No abscess. No free air. 3. Chronically enlarged lymph node in the porta hepatis which has slightly increased from prior, likely reactive. Prominent central mesenteric nodes measuring up to 9 mm short axis.   Medications   Current Outpatient Medications  Medication Sig Dispense Refill   acetaminophen (TYLENOL) 500 MG tablet Take 1,000-1,500 mg by mouth every 6 (six) hours as needed for moderate pain or headache.     busPIRone (BUSPAR) 7.5 MG tablet Take 1 tablet by mouth daily as needed. (Patient not taking: Reported on  03/12/2023)     dicyclomine (BENTYL) 20 MG tablet Take 1 tablet (20 mg total) by mouth 2 (two) times daily as needed for spasms. 20 tablet 0   escitalopram (LEXAPRO) 10 MG tablet Take 10 mg by mouth daily. (Patient not taking: Reported on 03/12/2023)     ondansetron (ZOFRAN-ODT) 4 MG disintegrating tablet Take 1 tablet (4 mg total) by mouth every 8 (eight) hours as needed for nausea or vomiting. (Patient not taking: Reported on 03/12/2023) 20 tablet 0   No current facility-administered medications for this visit.    Allergies   Allergies as of 03/19/2023   (No Known Allergies)    Past Medical History   Past Medical History:  Diagnosis Date   Asthma    Concussion 07/2016   has been cleared to play soccer again    Past Surgical History   Past Surgical History:  Procedure Laterality Date   OPEN REDUCTION INTERNAL FIXATION (ORIF) METACARPAL Right 09/01/2016   Procedure: OPEN REDUCTION INTERNAL FIXATION (ORIF) RIGHT FIFTH METACARPAL;  Surgeon: Vickki Hearing, MD;  Location: [facility];  Service: Orthopedics;  Laterality: Right;  right fifth metacarpal   TRIGGER FINGER RELEASE Right 10/29/2020   Procedure: RELEASE TRIGGER FINGER/A-1 PULLEY right small finger/ and HARDWARE REMOVAL RIGHT HAND;  Surgeon: Vickki Hearing, MD;  Location: [facility];  Service: Orthopedics;  Laterality: Right;    Past Family History   Family History  Problem Relation Age of Onset   Hyperlipidemia Paternal Grandfather    Diabetes Paternal Grandfather     Past Social History   Social  History   Socioeconomic History   Marital status: Single    Spouse name: Not on file   Number of children: Not on file   Years of education: Not on file   Highest education level: Not on file  Occupational History   Not on file  Tobacco Use   Smoking status: Passive Smoke Exposure - Never Smoker   Smokeless tobacco: Never  Vaping Use   Vaping Use: Every day   Substances: Nicotine, Flavoring  Substance and Sexual  Activity   Alcohol use: No   Drug use: No   Sexual activity: Never    Birth control/protection: None  Other Topics Concern   Not on file  Social History Narrative   Not on file   Social Determinants of Health   Financial Resource Strain: Not on file  Food Insecurity: Not on file  Transportation Needs: Not on file  Physical Activity: Not on file  Stress: Not on file  Social Connections: Not on file  Intimate Partner Violence: Not on file    Review of Systems   General: Negative for anorexia, weight loss, fever, chills, fatigue, weakness. Eyes: Negative for vision changes.  ENT: Negative for hoarseness, difficulty swallowing , nasal congestion. CV: Negative for chest pain, angina, palpitations, dyspnea on exertion, peripheral edema.  Respiratory: Negative for dyspnea at rest, dyspnea on exertion, cough, sputum, wheezing.  GI: See history of present illness. GU:  Negative for dysuria, hematuria, urinary incontinence, urinary frequency, nocturnal urination.  MS: Negative for joint pain, low back pain.  Derm: Negative for rash or itching.  Neuro: Negative for weakness, abnormal sensation, seizure, frequent headaches, memory loss,  confusion.  Psych: Negative for anxiety, depression, suicidal ideation, hallucinations.  Endo: Negative for unusual weight change.  Heme: Negative for bruising or bleeding. Allergy: Negative for rash or hives.  Physical Exam   There were no vitals taken for this visit.   General: Well-nourished, well-developed in no acute distress.  Head: Normocephalic, atraumatic.   Eyes: Conjunctiva pink, no icterus. Mouth: Oropharyngeal mucosa moist and pink , no lesions erythema or exudate. Neck: Supple without thyromegaly, masses, or lymphadenopathy.  Lungs: Clear to auscultation bilaterally.  Heart: Regular rate and rhythm, no murmurs rubs or gallops.  Abdomen: Bowel sounds are normal, nontender, nondistended, no hepatosplenomegaly or masses,  no  abdominal bruits or hernia, no rebound or guarding.   Rectal: *** Extremities: No lower extremity edema. No clubbing or deformities.  Neuro: Alert and oriented x 4 , grossly normal neurologically.  Skin: Warm and dry, no rash or jaundice.   Psych: Alert and cooperative, normal mood and affect.  Labs   Lab Results  Component Value Date   CREATININE 0.69 03/13/2023   BUN 6 03/13/2023   NA 139 03/13/2023   K 3.7 03/13/2023   CL 105 03/13/2023   CO2 24 03/13/2023   Lab Results  Component Value Date   ALT 23 03/13/2023   AST 29 03/13/2023   ALKPHOS 53 03/13/2023   BILITOT 1.1 03/13/2023   Lab Results  Component Value Date   WBC 7.9 03/13/2023   HGB 13.9 03/13/2023   HCT 42.0 03/13/2023   MCV 91.9 03/13/2023   PLT 266 03/13/2023   Lab Results  Component Value Date   LIPASE 33 03/13/2023    Imaging Studies   CT ABDOMEN PELVIS W CONTRAST  Result Date: 03/12/2023 CLINICAL DATA:  Right lower quadrant abdominal pain, emesis, diarrhea EXAM: CT ABDOMEN AND PELVIS WITH CONTRAST TECHNIQUE: Multidetector CT imaging of  the abdomen and pelvis was performed using the standard protocol following bolus administration of intravenous contrast. RADIATION DOSE REDUCTION: This exam was performed according to the departmental dose-optimization program which includes automated exposure control, adjustment of the mA and/or kV according to patient size and/or use of iterative reconstruction technique. CONTRAST:  OMNIPAQUE IOHEXOL 300 MG/ML  SOLN COMPARISON:  12/03/2020 FINDINGS: Lower chest: No acute abnormality. Hepatobiliary: No solid liver abnormality is seen. No gallstones, gallbladder wall thickening, or biliary dilatation. Pancreas: Unremarkable. No pancreatic ductal dilatation or surrounding inflammatory changes. Spleen: Normal in size without significant abnormality. Adrenals/Urinary Tract: Adrenal glands are unremarkable. Kidneys are normal, without renal calculi, solid lesion, or  hydronephrosis. Bladder is unremarkable. Stomach/Bowel: Stomach is within normal limits. Appendix appears normal (series 5, image 57). No evidence of bowel wall thickening, distention, or inflammatory changes. Vascular/Lymphatic: No significant vascular findings are present. Unchanged prominent right lower quadrant mesenteric lymph nodes (series 2, image 48). No other enlarged abdominal or pelvic lymph nodes. Reproductive: No mass or other significant abnormality. Other: No abdominal wall hernia or abnormality. Small volume free fluid in the low pelvis. Musculoskeletal: No acute or significant osseous findings. IMPRESSION: 1. No acute CT findings of the abdomen or pelvis to explain right lower quadrant abdominal pain. Normal appendix. 2. Small volume free fluid in the low pelvis, nonspecific and possibly reactive. 3. Unchanged prominent right lower quadrant mesenteric lymph nodes, most likely reactive. Electronically Signed   By: Jearld Lesch M.D.   On: 03/12/2023 14:07    Assessment       PLAN   ***   Leanna Battles. Melvyn Neth, MHS, PA-C [facility]

## 2023-03-19 ENCOUNTER — Encounter: Payer: Self-pay | Admitting: Gastroenterology

## 2023-03-19 ENCOUNTER — Other Ambulatory Visit (HOSPITAL_COMMUNITY)
Admission: RE | Admit: 2023-03-19 | Discharge: 2023-03-19 | Disposition: A | Discharge status: Home / Self Care | Payer: PRIVATE HEALTH INSURANCE | Source: Ambulatory Visit | Attending: Gastroenterology | Admitting: Gastroenterology

## 2023-03-19 ENCOUNTER — Ambulatory Visit: Payer: PRIVATE HEALTH INSURANCE | Admitting: Gastroenterology

## 2023-03-19 VITALS — BP 131/75 | HR 86 | Temp 98.3°F | Ht 74.0 in | Wt 159.2 lb

## 2023-03-19 DIAGNOSIS — A09 Infectious gastroenteritis and colitis, unspecified: Secondary | ICD-10-CM | POA: Insufficient documentation

## 2023-03-19 DIAGNOSIS — R112 Nausea with vomiting, unspecified: Secondary | ICD-10-CM | POA: Diagnosis not present

## 2023-03-19 DIAGNOSIS — R1030 Lower abdominal pain, unspecified: Secondary | ICD-10-CM | POA: Diagnosis present

## 2023-03-19 LAB — CBC WITH DIFFERENTIAL/PLATELET
Abs Immature Granulocytes: 0.01 10*3/uL (ref 0.00–0.07)
Basophils Absolute: 0.1 10*3/uL (ref 0.0–0.1)
Basophils Relative: 1 %
Eosinophils Absolute: 0.1 10*3/uL (ref 0.0–0.5)
Eosinophils Relative: 1 %
HCT: 46.4 % (ref 39.0–52.0)
Hemoglobin: 16.1 g/dL (ref 13.0–17.0)
Immature Granulocytes: 0 %
Lymphocytes Relative: 30 %
Lymphs Abs: 1.5 10*3/uL (ref 0.7–4.0)
MCH: 31.3 pg (ref 26.0–34.0)
MCHC: 34.7 g/dL (ref 30.0–36.0)
MCV: 90.1 fL (ref 80.0–100.0)
Monocytes Absolute: 0.4 10*3/uL (ref 0.1–1.0)
Monocytes Relative: 9 %
Neutro Abs: 2.8 10*3/uL (ref 1.7–7.7)
Neutrophils Relative %: 59 %
Platelets: 245 10*3/uL (ref 150–400)
RBC: 5.15 MIL/uL (ref 4.22–5.81)
RDW: 12 % (ref 11.5–15.5)
WBC: 4.8 10*3/uL (ref 4.0–10.5)
nRBC: 0 % (ref 0.0–0.2)

## 2023-03-19 LAB — SEDIMENTATION RATE: Sed Rate: 5 mm/hr (ref 0–16)

## 2023-03-19 LAB — C-REACTIVE PROTEIN: CRP: <0.5 mg/dL (ref <1.0)

## 2023-03-19 MED ORDER — HYOSCYAMINE SULFATE 0.125 MG PO TABS: 0.1250 mg | ORAL_TABLET | Freq: Four times a day (QID) | ORAL | 0 refills | Status: AC | PRN

## 2023-03-19 NOTE — Patient Instructions (Addendum)
Please go to [facility] lab today for labs. You will take the stool orders to a [facility] to complete.  Trial of Levsin dissolve on the tongue up to four times daily for abdominal pain.

## 2023-03-20 LAB — IGA: IgA: 274 mg/dL (ref 90–386)

## 2023-03-20 LAB — TISSUE TRANSGLUTAMINASE, IGA: Tissue Transglutaminase Ab, IgA: <2 U/mL (ref 0–3)

## 2023-03-22 ENCOUNTER — Encounter: Payer: Self-pay | Admitting: Gastroenterology

## 2023-03-22 LAB — MISC LABCORP TEST (SEND OUT): Labcorp test code: 650003

## 2023-03-22 LAB — GI PROFILE, STOOL, PCR

## 2023-03-22 LAB — C DIFFICILE, CYTOTOXIN B

## 2023-03-23 LAB — C DIFFICILE, CYTOTOXIN B

## 2023-03-24 LAB — C DIFFICILE TOXINS A+B W/RFLX: C difficile Toxins A+B, EIA: NEGATIVE

## 2023-03-28 ENCOUNTER — Other Ambulatory Visit: Payer: Self-pay | Admitting: Gastroenterology

## 2023-03-28 ENCOUNTER — Encounter: Payer: Self-pay | Admitting: *Deleted

## 2023-03-28 MED ORDER — PANTOPRAZOLE SODIUM 40 MG PO TBEC: 40.0000 mg | DELAYED_RELEASE_TABLET | Freq: Every day | ORAL | 2 refills | Status: AC

## 2024-10-16 ENCOUNTER — Encounter: Payer: Self-pay | Admitting: Gastroenterology
